# Patient Record
Sex: Female | Born: 1970 | Race: Black or African American | Hispanic: No | Smoking: Current every day smoker
Health system: Southern US, Community
[De-identification: ages and names within clinical notes are randomized; demographics above are authoritative.]

## PROBLEM LIST (undated history)

## (undated) ENCOUNTER — Ambulatory Visit: Admission: EM

## (undated) HISTORY — PX: VAGINAL HYSTERECTOMY: SUR661

## (undated) HISTORY — PX: APPENDECTOMY: SHX54

## (undated) HISTORY — PX: TONSILLECTOMY: SHX5217

---

## 2004-11-20 ENCOUNTER — Emergency Department: Payer: Self-pay | Admitting: Emergency Medicine

## 2006-04-11 ENCOUNTER — Ambulatory Visit: Payer: Self-pay | Admitting: Family

## 2009-09-18 ENCOUNTER — Emergency Department: Payer: Self-pay | Admitting: Emergency Medicine

## 2013-05-24 ENCOUNTER — Emergency Department: Payer: Self-pay | Admitting: Emergency Medicine

## 2015-07-08 ENCOUNTER — Ambulatory Visit: Payer: Self-pay | Admitting: Family Medicine

## 2015-10-04 ENCOUNTER — Other Ambulatory Visit: Payer: Self-pay | Admitting: Family Medicine

## 2015-10-17 ENCOUNTER — Other Ambulatory Visit: Payer: Self-pay

## 2015-11-04 ENCOUNTER — Ambulatory Visit: Payer: Self-pay | Admitting: Family Medicine

## 2015-11-21 ENCOUNTER — Other Ambulatory Visit: Payer: Self-pay

## 2015-11-21 MED ORDER — ATENOLOL 25 MG PO TABS: 25.0000 mg | ORAL_TABLET | Freq: Every day | ORAL | Status: DC

## 2015-11-21 MED ORDER — HYDROCHLOROTHIAZIDE 25 MG PO TABS: 25.0000 mg | ORAL_TABLET | Freq: Every day | ORAL | Status: DC

## 2015-11-28 ENCOUNTER — Ambulatory Visit: Payer: Self-pay | Admitting: Family Medicine

## 2015-11-28 ENCOUNTER — Encounter: Payer: Self-pay | Admitting: Family Medicine

## 2015-11-28 ENCOUNTER — Ambulatory Visit (INDEPENDENT_AMBULATORY_CARE_PROVIDER_SITE_OTHER): Payer: Commercial Managed Care - PPO | Admitting: Family Medicine

## 2015-11-28 VITALS — BP 140/100 | HR 80 | Ht 66.0 in | Wt 192.0 lb

## 2015-11-28 DIAGNOSIS — I10 Essential (primary) hypertension: Secondary | ICD-10-CM | POA: Diagnosis not present

## 2015-11-28 DIAGNOSIS — F419 Anxiety disorder, unspecified: Secondary | ICD-10-CM

## 2015-11-28 DIAGNOSIS — F32A Depression, unspecified: Secondary | ICD-10-CM

## 2015-11-28 DIAGNOSIS — F418 Other specified anxiety disorders: Secondary | ICD-10-CM | POA: Diagnosis not present

## 2015-11-28 DIAGNOSIS — F329 Major depressive disorder, single episode, unspecified: Secondary | ICD-10-CM

## 2015-11-28 DIAGNOSIS — M1731 Unilateral post-traumatic osteoarthritis, right knee: Secondary | ICD-10-CM | POA: Diagnosis not present

## 2015-11-28 MED ORDER — HYDROCHLOROTHIAZIDE 25 MG PO TABS: 25.0000 mg | ORAL_TABLET | Freq: Every day | ORAL | Status: DC

## 2015-11-28 MED ORDER — MELOXICAM 15 MG PO TABS: 15.0000 mg | ORAL_TABLET | Freq: Every day | ORAL | Status: DC

## 2015-11-28 MED ORDER — ATENOLOL 25 MG PO TABS: 25.0000 mg | ORAL_TABLET | Freq: Every day | ORAL | Status: DC

## 2015-11-28 MED ORDER — CITALOPRAM HYDROBROMIDE 20 MG PO TABS: ORAL_TABLET | ORAL | Status: DC

## 2015-11-28 NOTE — Progress Notes (Signed)
Name: Mallory Gardner   MRN: [id number]    DOB: 07/06/1971   Date:11/28/2015       Progress Note  Subjective  Chief Complaint  Chief Complaint  Patient presents with  . Hypertension  . Depression    Hypertension This is a chronic problem. The current episode started more than 1 year ago. The problem has been waxing and waning since onset. The problem is uncontrolled (no meds at present). Pertinent negatives include no anxiety, blurred vision, chest pain, headaches, malaise/fatigue, neck pain, orthopnea, palpitations, peripheral edema, PND, shortness of breath or sweats. There are no associated agents to hypertension. Risk factors for coronary artery disease include dyslipidemia and smoking/tobacco exposure. Past treatments include beta blockers and diuretics. The current treatment provides no improvement. There are no compliance problems.  There is no history of angina, kidney disease, CAD/MI, CVA, heart failure, left ventricular hypertrophy, PVD, renovascular disease or retinopathy. There is no history of chronic renal disease or a hypertension causing med.  Depression        This is a chronic problem.  The current episode started more than 1 year ago.   The problem has been gradually improving since onset.  Associated symptoms include insomnia and sad.  Associated symptoms include no decreased concentration, no fatigue, no helplessness, no hopelessness, not irritable, no restlessness, no decreased interest, no appetite change, no myalgias, no headaches and no suicidal ideas.     The symptoms are aggravated by family issues (maternal death).  Past treatments include SSRIs - Selective serotonin reuptake inhibitors.  Compliance with treatment is good.   Pertinent negatives include no anxiety. Knee Pain  The incident occurred more than 1 week ago. The injury mechanism was a twisting injury. The pain is present in the right knee. The pain is mild. The pain has been improving since onset. Pertinent  negatives include no loss of motion or tingling. The symptoms are aggravated by movement. She has tried acetaminophen and NSAIDs for the symptoms. The treatment provided mild relief.    No problem-specific assessment & plan notes found for this encounter.   No past medical history on file.  Past Surgical History  Procedure Laterality Date  . Vaginal hysterectomy    . Appendectomy    . Tonsillectomy      No family history on file.  Social History   Social History  . Marital Status: Divorced    Spouse Name: N/A  . Number of Children: N/A  . Years of Education: N/A   Occupational History  . Not on file.   Social History Main Topics  . Smoking status: Current Every Day Smoker -- 0.50 packs/day    Types: Cigarettes  . Smokeless tobacco: Not on file  . Alcohol Use: 0.0 oz/week    0 Standard drinks or equivalent per week  . Drug Use: No  . Sexual Activity: Not on file   Other Topics Concern  . Not on file   Social History Narrative  . No narrative on file    No Known Allergies   Review of Systems  Constitutional: Negative for fever, chills, weight loss, malaise/fatigue, appetite change and fatigue.  HENT: Negative for ear discharge, ear pain and sore throat.   Eyes: Negative for blurred vision.  Respiratory: Negative for cough, sputum production, shortness of breath and wheezing.   Cardiovascular: Negative for chest pain, palpitations, orthopnea, leg swelling and PND.  Gastrointestinal: Negative for heartburn, nausea, abdominal pain, diarrhea, constipation, blood in stool and melena.  Genitourinary: Negative  for dysuria, urgency, frequency and hematuria.  Musculoskeletal: Negative for myalgias, back pain, joint pain and neck pain.  Skin: Negative for rash.  Neurological: Negative for dizziness, tingling, sensory change, focal weakness and headaches.  Endo/Heme/Allergies: Negative for environmental allergies and polydipsia. Does not bruise/bleed easily.   Psychiatric/Behavioral: Positive for depression. Negative for suicidal ideas and decreased concentration. The patient has insomnia. The patient is not nervous/anxious.      Objective  Filed Vitals:   11/28/15 1649  BP: 140/100  Pulse: 80  Height: 5\' 6"  (1.676 m)  Weight: 192 lb (87.091 kg)    Physical Exam  Constitutional: She is well-developed, well-nourished, and in no distress. She is not irritable. No distress.  HENT:  Head: Normocephalic and atraumatic.  Right Ear: External ear normal.  Left Ear: External ear normal.  Nose: Nose normal.  Mouth/Throat: Oropharynx is clear and moist.  Eyes: Conjunctivae and EOM are normal. Pupils are equal, round, and reactive to light. Right eye exhibits no discharge. Left eye exhibits no discharge.  Neck: Normal range of motion. Neck supple. No JVD present. No thyromegaly present.  Cardiovascular: Normal rate, regular rhythm, normal heart sounds and intact distal pulses.  Exam reveals no gallop and no friction rub.   No murmur heard. Pulmonary/Chest: Effort normal and breath sounds normal. Right breast exhibits no inverted nipple, no mass, no nipple discharge, no skin change and no tenderness. Left breast exhibits no inverted nipple, no mass, no nipple discharge, no skin change and no tenderness. Breasts are symmetrical.  Abdominal: Soft. Bowel sounds are normal. She exhibits no mass. There is no tenderness. There is no guarding.  Musculoskeletal: Normal range of motion. She exhibits no edema.  Lymphadenopathy:    She has no cervical adenopathy.  Neurological: She is alert. She has normal reflexes.  Skin: Skin is warm and dry. She is not diaphoretic.  Psychiatric: Mood and affect normal.  Nursing note and vitals reviewed.     Assessment & Plan  Problem List Items Addressed This Visit    None    Visit Diagnoses    Essential hypertension    -  Primary    Relevant Medications    atenolol (TENORMIN) 25 MG tablet     hydrochlorothiazide (HYDRODIURIL) 25 MG tablet    Post-traumatic osteoarthritis of right knee        Relevant Medications    meloxicam (MOBIC) 15 MG tablet    Anxiety and depression        Relevant Medications    citalopram (CELEXA) 20 MG tablet         Dr. Hayden Rasmusseneanna Selby Foisy [facility]  11/28/2015

## 2016-07-10 ENCOUNTER — Other Ambulatory Visit: Payer: Self-pay | Admitting: Family Medicine

## 2016-07-10 DIAGNOSIS — F419 Anxiety disorder, unspecified: Principal | ICD-10-CM

## 2016-07-10 DIAGNOSIS — F32A Depression, unspecified: Secondary | ICD-10-CM

## 2016-07-10 DIAGNOSIS — F329 Major depressive disorder, single episode, unspecified: Secondary | ICD-10-CM

## 2016-07-10 DIAGNOSIS — M1731 Unilateral post-traumatic osteoarthritis, right knee: Secondary | ICD-10-CM

## 2016-07-13 ENCOUNTER — Other Ambulatory Visit: Payer: Self-pay

## 2016-07-13 DIAGNOSIS — I10 Essential (primary) hypertension: Secondary | ICD-10-CM

## 2016-07-13 MED ORDER — ATENOLOL 25 MG PO TABS: 25.0000 mg | ORAL_TABLET | Freq: Every day | ORAL | 0 refills | Status: DC

## 2016-07-13 MED ORDER — HYDROCHLOROTHIAZIDE 25 MG PO TABS: 25.0000 mg | ORAL_TABLET | Freq: Every day | ORAL | 0 refills | Status: DC

## 2016-08-07 ENCOUNTER — Ambulatory Visit (INDEPENDENT_AMBULATORY_CARE_PROVIDER_SITE_OTHER): Payer: Commercial Managed Care - PPO | Admitting: Family Medicine

## 2016-08-07 VITALS — BP 142/92 | HR 78 | Temp 98.7°F | Ht 66.0 in | Wt 215.0 lb

## 2016-08-07 DIAGNOSIS — E785 Hyperlipidemia, unspecified: Secondary | ICD-10-CM

## 2016-08-07 DIAGNOSIS — M1731 Unilateral post-traumatic osteoarthritis, right knee: Secondary | ICD-10-CM | POA: Diagnosis not present

## 2016-08-07 DIAGNOSIS — F418 Other specified anxiety disorders: Secondary | ICD-10-CM

## 2016-08-07 DIAGNOSIS — F17210 Nicotine dependence, cigarettes, uncomplicated: Secondary | ICD-10-CM | POA: Diagnosis not present

## 2016-08-07 DIAGNOSIS — F32A Depression, unspecified: Secondary | ICD-10-CM

## 2016-08-07 DIAGNOSIS — F419 Anxiety disorder, unspecified: Secondary | ICD-10-CM

## 2016-08-07 DIAGNOSIS — I10 Essential (primary) hypertension: Secondary | ICD-10-CM

## 2016-08-07 DIAGNOSIS — F329 Major depressive disorder, single episode, unspecified: Secondary | ICD-10-CM

## 2016-08-07 MED ORDER — VARENICLINE TARTRATE 0.5 MG X 11 & 1 MG X 42 PO MISC: ORAL | 0 refills | Status: AC

## 2016-08-07 MED ORDER — MELOXICAM 15 MG PO TABS: 15.0000 mg | ORAL_TABLET | Freq: Every day | ORAL | 6 refills | Status: DC

## 2016-08-07 MED ORDER — CITALOPRAM HYDROBROMIDE 20 MG PO TABS: 20.0000 mg | ORAL_TABLET | Freq: Every day | ORAL | 6 refills | Status: DC

## 2016-08-07 MED ORDER — ATENOLOL 25 MG PO TABS: 25.0000 mg | ORAL_TABLET | Freq: Every day | ORAL | 6 refills | Status: DC

## 2016-08-07 MED ORDER — HYDROCHLOROTHIAZIDE 25 MG PO TABS: 25.0000 mg | ORAL_TABLET | Freq: Every day | ORAL | 6 refills | Status: DC

## 2016-08-07 NOTE — Progress Notes (Signed)
Name: Mallory Gardner   MRN: [id number]    DOB: 05/02/1971   Date:08/07/2016       Progress Note  Subjective  Chief Complaint  Chief Complaint  Patient presents with  . Follow-up    mediaction refill    Hypertension  This is a chronic problem. The current episode started more than 1 year ago. The problem has been waxing and waning since onset. The problem is controlled. Pertinent negatives include no anxiety, blurred vision, chest pain, headaches, malaise/fatigue, neck pain, orthopnea, palpitations, peripheral edema, PND, shortness of breath or sweats. There are no associated agents to hypertension. There are no known risk factors for coronary artery disease. Past treatments include beta blockers and diuretics. The current treatment provides moderate improvement. There are no compliance problems.  There is no history of angina, kidney disease, CAD/MI, CVA, heart failure, left ventricular hypertrophy, PVD, renovascular disease or retinopathy. There is no history of chronic renal disease or a hypertension causing med.  Knee Pain   There was no injury mechanism. The pain is present in the right knee ("decades"). The quality of the pain is described as aching. The pain is moderate. The pain has been intermittent since onset. Pertinent negatives include no inability to bear weight, loss of motion, loss of sensation, muscle weakness, numbness or tingling. She reports no foreign bodies present. Nothing aggravates the symptoms. She has tried NSAIDs for the symptoms.  Depression         The current episode started more than 1 year ago.   The problem has been waxing and waning since onset.  Associated symptoms include irritable.  Associated symptoms include no decreased concentration, no fatigue, no helplessness, no hopelessness, does not have insomnia, no restlessness, no decreased interest, no appetite change, no body aches, no myalgias, no headaches, no indigestion, not sad and no suicidal ideas.  Past  treatments include SSRIs - Selective serotonin reuptake inhibitors.  Compliance with treatment is good.  Previous treatment provided mild relief.   Pertinent negatives include no anxiety.   No problem-specific Assessment & Plan notes found for this encounter.   No past medical history on file.  Past Surgical History:  Procedure Laterality Date  . APPENDECTOMY    . TONSILLECTOMY    . VAGINAL HYSTERECTOMY      No family history on file.  Social History   Social History  . Marital status: Divorced    Spouse name: N/A  . Number of children: N/A  . Years of education: N/A   Occupational History  . Not on file.   Social History Main Topics  . Smoking status: Current Every Day Smoker    Packs/day: 0.50    Types: Cigarettes  . Smokeless tobacco: Not on file  . Alcohol use 0.0 oz/week  . Drug use: No  . Sexual activity: Not on file   Other Topics Concern  . Not on file   Social History Narrative  . No narrative on file    No Known Allergies   Review of Systems  Constitutional: Negative for appetite change, chills, fatigue, fever, malaise/fatigue and weight loss.  HENT: Negative for ear discharge, ear pain and sore throat.   Eyes: Negative for blurred vision.  Respiratory: Negative for cough, sputum production, shortness of breath and wheezing.   Cardiovascular: Negative for chest pain, palpitations, orthopnea, leg swelling and PND.  Gastrointestinal: Negative for abdominal pain, blood in stool, constipation, diarrhea, heartburn, melena and nausea.  Genitourinary: Negative for dysuria, frequency, hematuria and urgency.  Musculoskeletal: Negative for back pain, joint pain, myalgias and neck pain.  Skin: Negative for rash.  Neurological: Negative for dizziness, tingling, sensory change, focal weakness, numbness and headaches.  Endo/Heme/Allergies: Negative for environmental allergies and polydipsia. Does not bruise/bleed easily.  Psychiatric/Behavioral: Positive for  depression. Negative for decreased concentration and suicidal ideas. The patient is not nervous/anxious and does not have insomnia.      Objective  Vitals:   08/07/16 1604  BP: (!) 142/92  Pulse: 78  Temp: 98.7 F (37.1 C)  Weight: 215 lb (97.5 kg)  Height: 5\' 6"  (1.676 m)    Physical Exam  Constitutional: She is well-developed, well-nourished, and in no distress. She is irritable. No distress.  HENT:  Head: Normocephalic and atraumatic.  Right Ear: External ear normal.  Left Ear: External ear normal.  Nose: Nose normal.  Mouth/Throat: Oropharynx is clear and moist.  Eyes: Conjunctivae and EOM are normal. Pupils are equal, round, and reactive to light. Right eye exhibits no discharge. Left eye exhibits no discharge.  Neck: Normal range of motion. Neck supple. No JVD present. No thyromegaly present.  Cardiovascular: Normal rate, regular rhythm, normal heart sounds and intact distal pulses.  Exam reveals no gallop and no friction rub.   No murmur heard. Pulmonary/Chest: Effort normal and breath sounds normal.  Abdominal: Soft. Bowel sounds are normal. She exhibits no mass. There is no tenderness. There is no guarding.  Musculoskeletal: Normal range of motion. She exhibits no edema.  Lymphadenopathy:    She has no cervical adenopathy.  Neurological: She is alert.  Skin: Skin is warm and dry. She is not diaphoretic.  Psychiatric: Mood and affect normal.  Nursing note and vitals reviewed.     Assessment & Plan  Problem List Items Addressed This Visit    None    Visit Diagnoses    Cigarette nicotine dependence without complication    -  Primary   Relevant Medications   varenicline (CHANTIX STARTING MONTH PAK) 0.5 MG X 11 & 1 MG X 42 tablet   Essential hypertension       Relevant Medications   atenolol (TENORMIN) 25 MG tablet   hydrochlorothiazide (HYDRODIURIL) 25 MG tablet   Other Relevant Orders   Renal function panel   Anxiety and depression       Relevant  Medications   citalopram (CELEXA) 20 MG tablet   Post-traumatic osteoarthritis of right knee       Relevant Medications   meloxicam (MOBIC) 15 MG tablet   Hyperlipidemia, unspecified hyperlipidemia type       Relevant Medications   atenolol (TENORMIN) 25 MG tablet   hydrochlorothiazide (HYDRODIURIL) 25 MG tablet   Other Relevant Orders   Lipid Profile        Dr. Hayden Rasmusseneanna Emika Tiano [facility]  08/07/16

## 2016-08-11 LAB — RENAL FUNCTION PANEL
Albumin: 4.5 g/dL (ref 3.5–5.5)
BUN / CREAT RATIO: 9 (ref 9–23)
BUN: 8 mg/dL (ref 6–24)
CO2: 29 mmol/L (ref 18–29)
Calcium: 10 mg/dL (ref 8.7–10.2)
Chloride: 99 mmol/L (ref 96–106)
Creatinine, Ser: 0.89 mg/dL (ref 0.57–1.00)
GFR, EST AFRICAN AMERICAN: 90 mL/min/{1.73_m2} (ref >59)
GFR, EST NON AFRICAN AMERICAN: 78 mL/min/{1.73_m2} (ref >59)
GLUCOSE: 89 mg/dL (ref 65–99)
PHOSPHORUS: 3.9 mg/dL (ref 2.5–4.5)
POTASSIUM: 4.2 mmol/L (ref 3.5–5.2)
SODIUM: 143 mmol/L (ref 134–144)

## 2016-08-11 LAB — LIPID PANEL W/O CHOL/HDL RATIO
Cholesterol, Total: 196 mg/dL (ref 100–199)
HDL: 58 mg/dL (ref >39)
LDL Calculated: 95 mg/dL (ref 0–99)
TRIGLYCERIDES: 217 mg/dL — AB (ref 0–149)
VLDL Cholesterol Cal: 43 mg/dL — ABNORMAL HIGH (ref 5–40)

## 2016-08-14 ENCOUNTER — Other Ambulatory Visit: Payer: Self-pay

## 2016-12-01 ENCOUNTER — Emergency Department: Payer: BLUE CROSS/BLUE SHIELD

## 2016-12-01 ENCOUNTER — Emergency Department
Admission: EM | Admit: 2016-12-01 | Discharge: 2016-12-01 | Disposition: A | Discharge status: Home / Self Care | Payer: BLUE CROSS/BLUE SHIELD | Attending: Emergency Medicine | Admitting: Emergency Medicine

## 2016-12-01 DIAGNOSIS — M25572 Pain in left ankle and joints of left foot: Secondary | ICD-10-CM

## 2016-12-01 DIAGNOSIS — F17210 Nicotine dependence, cigarettes, uncomplicated: Secondary | ICD-10-CM | POA: Diagnosis not present

## 2016-12-01 MED ORDER — TRAMADOL HCL 50 MG PO TABS: 50.0000 mg | ORAL_TABLET | Freq: Four times a day (QID) | ORAL | 0 refills | Status: AC | PRN

## 2016-12-01 NOTE — Discharge Instructions (Signed)
Please seek medical attention for any high fevers, chest pain, shortness of breath, change in behavior, persistent vomiting, bloody stool or any other new or concerning symptoms.  

## 2016-12-01 NOTE — ED Provider Notes (Signed)
[facility] Emergency Department Provider Note  ____________________________________________   I have reviewed the triage vital signs and the nursing notes.   HISTORY  Chief Complaint Ankle Pain   History limited by: Not Limited   HPI Mallory Gardner is a 46 y.o. female who presents to the emergency department today because of concerns for left ankle pain. She states that left ankle was slightly sore yesterday. However woke her up at night. She states is it extremely painful. It is worse when she plantar flexes the foot. She also feels like there is some swelling there. Patient denies any injury. Denies similar symptoms in the past. Denies any previous history of fracture of that ankle.   No past medical history on file.  There are no active problems to display for this patient.   Past Surgical History:  Procedure Laterality Date  . APPENDECTOMY    . TONSILLECTOMY    . VAGINAL HYSTERECTOMY      Prior to Admission medications   Medication Sig Start Date End Date Taking? Authorizing Provider  atenolol (TENORMIN) 25 MG tablet Take 1 tablet (25 mg total) by mouth daily. 08/07/16   Duanne Limerick, MD  citalopram (CELEXA) 20 MG tablet Take 1 tablet (20 mg total) by mouth daily. 08/07/16   Duanne Limerick, MD  hydrochlorothiazide (HYDRODIURIL) 25 MG tablet Take 1 tablet (25 mg total) by mouth daily. sched appt for med refill 08/07/16   Duanne Limerick, MD  meloxicam (MOBIC) 15 MG tablet Take 1 tablet (15 mg total) by mouth daily. 08/07/16   Duanne Limerick, MD  varenicline (CHANTIX STARTING MONTH PAK) 0.5 MG X 11 & 1 MG X 42 tablet Take one 0.5 mg tablet by mouth once daily for 3 days, then increase to one 0.5 mg tablet twice daily for 4 days, then increase to one 1 mg tablet twice daily. 08/07/16   Duanne Limerick, MD    Allergies Patient has no known allergies.  No family history on file.  Social History Social History  Substance Use Topics  . Smoking status:  Current Every Day Smoker    Packs/day: 0.50    Types: Cigarettes  . Smokeless tobacco: Not on file  . Alcohol use 0.0 oz/week    Review of Systems  Constitutional: Negative for fever. Cardiovascular: Negative for chest pain. Respiratory: Negative for shortness of breath. Gastrointestinal: Negative for abdominal pain, vomiting and diarrhea. Musculoskeletal: Positive for left ankle pain. Skin: Negative for rash. Neurological: Negative for headaches, focal weakness or numbness.  10-point ROS otherwise negative.  ____________________________________________   PHYSICAL EXAM:  VITAL SIGNS: ED Triage Vitals  Enc Vitals Group     BP 12/01/16 0529 (!) 149/92     Pulse Rate 12/01/16 0529 82     Resp 12/01/16 0529 20     Temp 12/01/16 0529 98 F (36.7 C)     Temp Source 12/01/16 0529 Oral     SpO2 12/01/16 0529 100 %     Weight 12/01/16 0529 210 lb (95.3 kg)     Height 12/01/16 0529  (1.676 m)     Head Circumference --      Peak Flow --      Pain Score 12/01/16 0528 8   Constitutional: Alert and oriented. Well appearing and in no distress. Eyes: Conjunctivae are normal. Normal extraocular movements. ENT   Head: Normocephalic and atraumatic.   Nose: No congestion/rhinnorhea.   Mouth/Throat: Mucous membranes are moist.   Neck:  No stridor. Hematological/Lymphatic/Immunilogical: No cervical lymphadenopathy. Cardiovascular: Normal rate, regular rhythm.  No murmurs, rubs, or gallops.  Respiratory: Normal respiratory effort without tachypnea nor retractions. Breath sounds are clear and equal bilaterally. No wheezes/rales/rhonchi. Gastrointestinal: Soft and non tender. No rebound. No guarding.  Genitourinary: Deferred Musculoskeletal: Left ankle with small amount of swelling. No skin change or erythema. No warmth. No significant tenderness to palpation. No significant tenderness to manipulation, however slightly worse with plantar flexion. Neurologic:  Normal speech  and language. No gross focal neurologic deficits are appreciated.  Skin:  Skin is warm, dry and intact. No rash noted. Psychiatric: Mood and affect are normal. Speech and behavior are normal. Patient exhibits appropriate insight and judgment.  ____________________________________________    LABS (pertinent positives/negatives)  None  ____________________________________________   EKG  None  ____________________________________________    RADIOLOGY  Left ankle IMPRESSION:  Subchondral lucency in the lateral talar dome consistent with  osteochondral lesion. This can be seen in the setting of repetitive  microtrauma. Osteoarthritis is fell less likely given preservation  of joint space. Small tibial talar joint effusion.    If there are any signs or symptoms of infection, MRI can be  considered.       ____________________________________________   PROCEDURES  Procedures  ____________________________________________   INITIAL IMPRESSION / ASSESSMENT AND PLAN / ED COURSE  Pertinent labs & imaging results that were available during my care of the patient were reviewed by me and considered in my medical decision making (see chart for details).  Patient presented to the emergency department today because of concerns for left ankle pain. X-ray shows a subchondral lucency. At this point given lack of erythema, warmth to the joint, I have low suspicion for infection. Patient does state that she has knee issues and so does have to, say it is slightly odd manner for her foot and ankle. Will place patient in splint and give podiatry follow-up.  ____________________________________________   FINAL CLINICAL IMPRESSION(S) / ED DIAGNOSES  Final diagnoses:  Acute left ankle pain     Note: This dictation was prepared with Dragon dictation. Any transcriptional errors that result from this process are unintentional     Phineas Semen, MD 12/01/16 [phone]

## 2016-12-01 NOTE — ED Triage Notes (Signed)
Patient reports left ankle pain.  Denies any known injury.

## 2016-12-01 NOTE — ED Notes (Signed)
Assisted pt out of car to wheelchair with c/o left ankle pain; no injury

## 2017-03-25 ENCOUNTER — Other Ambulatory Visit: Payer: Self-pay | Admitting: Family Medicine

## 2017-03-25 DIAGNOSIS — F329 Major depressive disorder, single episode, unspecified: Secondary | ICD-10-CM

## 2017-03-25 DIAGNOSIS — M1731 Unilateral post-traumatic osteoarthritis, right knee: Secondary | ICD-10-CM

## 2017-03-25 DIAGNOSIS — F32A Depression, unspecified: Secondary | ICD-10-CM

## 2017-03-25 DIAGNOSIS — F419 Anxiety disorder, unspecified: Principal | ICD-10-CM

## 2017-03-26 ENCOUNTER — Other Ambulatory Visit: Payer: Self-pay

## 2017-03-26 DIAGNOSIS — I10 Essential (primary) hypertension: Secondary | ICD-10-CM

## 2017-03-26 MED ORDER — HYDROCHLOROTHIAZIDE 25 MG PO TABS: 25.0000 mg | ORAL_TABLET | Freq: Every day | ORAL | 0 refills | Status: DC

## 2017-03-26 MED ORDER — ATENOLOL 25 MG PO TABS: 25.0000 mg | ORAL_TABLET | Freq: Every day | ORAL | 0 refills | Status: DC

## 2017-04-17 ENCOUNTER — Ambulatory Visit: Payer: Commercial Managed Care - PPO | Admitting: Family Medicine

## 2017-04-26 ENCOUNTER — Encounter: Payer: Self-pay | Admitting: Family Medicine

## 2017-04-26 ENCOUNTER — Ambulatory Visit (INDEPENDENT_AMBULATORY_CARE_PROVIDER_SITE_OTHER): Payer: BLUE CROSS/BLUE SHIELD | Admitting: Family Medicine

## 2017-04-26 VITALS — BP 140/98 | HR 80 | Ht 66.0 in | Wt 212.0 lb

## 2017-04-26 DIAGNOSIS — Z1231 Encounter for screening mammogram for malignant neoplasm of breast: Secondary | ICD-10-CM

## 2017-04-26 DIAGNOSIS — Z1239 Encounter for other screening for malignant neoplasm of breast: Secondary | ICD-10-CM

## 2017-04-26 DIAGNOSIS — F329 Major depressive disorder, single episode, unspecified: Secondary | ICD-10-CM | POA: Diagnosis not present

## 2017-04-26 DIAGNOSIS — Z23 Encounter for immunization: Secondary | ICD-10-CM | POA: Diagnosis not present

## 2017-04-26 DIAGNOSIS — F419 Anxiety disorder, unspecified: Secondary | ICD-10-CM | POA: Diagnosis not present

## 2017-04-26 DIAGNOSIS — M1731 Unilateral post-traumatic osteoarthritis, right knee: Secondary | ICD-10-CM

## 2017-04-26 DIAGNOSIS — F32A Depression, unspecified: Secondary | ICD-10-CM

## 2017-04-26 DIAGNOSIS — I10 Essential (primary) hypertension: Secondary | ICD-10-CM

## 2017-04-26 MED ORDER — ATENOLOL 25 MG PO TABS: 25.0000 mg | ORAL_TABLET | Freq: Every day | ORAL | 1 refills | Status: AC

## 2017-04-26 MED ORDER — MELOXICAM 15 MG PO TABS: 15.0000 mg | ORAL_TABLET | Freq: Every day | ORAL | 1 refills | Status: AC

## 2017-04-26 MED ORDER — HYDROCHLOROTHIAZIDE 25 MG PO TABS: 25.0000 mg | ORAL_TABLET | Freq: Every day | ORAL | 1 refills | Status: AC

## 2017-04-26 MED ORDER — CITALOPRAM HYDROBROMIDE 20 MG PO TABS: 20.0000 mg | ORAL_TABLET | Freq: Every day | ORAL | 1 refills | Status: DC

## 2017-04-26 NOTE — Progress Notes (Signed)
Name: Mallory Gardner   MRN: [id number]    DOB: May 06, 1971   Date:04/26/2017       Progress Note  Subjective  Chief Complaint  Chief Complaint  Patient presents with  . Hypertension  . Depression    Hypertension  This is a chronic problem. The current episode started more than 1 year ago. The problem has been waxing and waning since onset. The problem is controlled. Pertinent negatives include no anxiety, blurred vision, chest pain, headaches, malaise/fatigue, neck pain, orthopnea, palpitations, peripheral edema, PND, shortness of breath or sweats. There are no associated agents to hypertension. There are no known risk factors for coronary artery disease. Past treatments include beta blockers and diuretics. The current treatment provides moderate improvement. There are no compliance problems.  There is no history of angina, kidney disease, CAD/MI, CVA, heart failure, left ventricular hypertrophy, PVD or retinopathy. There is no history of chronic renal disease, a hypertension causing med or renovascular disease.  Depression         This is a chronic problem.  The current episode started more than 1 year ago.   The onset quality is sudden.   The problem has been gradually improving since onset.  Associated symptoms include no decreased concentration, no fatigue, no helplessness, no hopelessness, does not have insomnia, not irritable, no restlessness, no decreased interest, no appetite change, no body aches, no myalgias, no headaches, no indigestion, not sad and no suicidal ideas.     The symptoms are aggravated by family issues.  Past treatments include SSRIs - Selective serotonin reuptake inhibitors.  Compliance with treatment is good.  Previous treatment provided moderate relief.   Pertinent negatives include no anxiety.   No problem-specific Assessment & Plan notes found for this encounter.   No past medical history on file.  Past Surgical History:  Procedure Laterality Date  . APPENDECTOMY     . TONSILLECTOMY    . VAGINAL HYSTERECTOMY      No family history on file.  Social History   Social History  . Marital status: Divorced    Spouse name: N/A  . Number of children: N/A  . Years of education: N/A   Occupational History  . Not on file.   Social History Main Topics  . Smoking status: Current Every Day Smoker    Packs/day: 0.50    Types: Cigarettes  . Smokeless tobacco: Never Used  . Alcohol use 0.0 oz/week  . Drug use: No  . Sexual activity: Not on file   Other Topics Concern  . Not on file   Social History Narrative  . No narrative on file    No Known Allergies  Outpatient Medications Prior to Visit  Medication Sig Dispense Refill  . atenolol (TENORMIN) 25 MG tablet Take 1 tablet (25 mg total) by mouth daily. 30 tablet 0  . citalopram (CELEXA) 20 MG tablet TAKE ONE TABLET BY MOUTH ONCE DAILY 30 tablet 0  . hydrochlorothiazide (HYDRODIURIL) 25 MG tablet Take 1 tablet (25 mg total) by mouth daily. sched appt for med refill 30 tablet 0  . meloxicam (MOBIC) 15 MG tablet TAKE ONE TABLET BY MOUTH ONCE DAILY 30 tablet 0  . traMADol (ULTRAM) 50 MG tablet Take 1 tablet (50 mg total) by mouth every 6 (six) hours as needed. (Patient not taking: Reported on 04/26/2017) 15 tablet 0  . varenicline (CHANTIX STARTING MONTH PAK) 0.5 MG X 11 & 1 MG X 42 tablet Take one 0.5 mg tablet by mouth once  daily for 3 days, then increase to one 0.5 mg tablet twice daily for 4 days, then increase to one 1 mg tablet twice daily. (Patient not taking: Reported on 04/26/2017) 53 tablet 0   No facility-administered medications prior to visit.     Review of Systems  Constitutional: Negative for appetite change, chills, fatigue, fever, malaise/fatigue and weight loss.  HENT: Negative for ear discharge, ear pain and sore throat.   Eyes: Negative for blurred vision.  Respiratory: Negative for cough, sputum production, shortness of breath and wheezing.   Cardiovascular: Negative for chest  pain, palpitations, orthopnea, leg swelling and PND.  Gastrointestinal: Negative for abdominal pain, blood in stool, constipation, diarrhea, heartburn, melena and nausea.  Genitourinary: Negative for dysuria, frequency, hematuria and urgency.  Musculoskeletal: Negative for back pain, joint pain, myalgias and neck pain.  Skin: Negative for rash.  Neurological: Negative for dizziness, tingling, sensory change, focal weakness and headaches.  Endo/Heme/Allergies: Negative for environmental allergies and polydipsia. Does not bruise/bleed easily.  Psychiatric/Behavioral: Positive for depression. Negative for decreased concentration and suicidal ideas. The patient is not nervous/anxious and does not have insomnia.      Objective  Vitals:   04/26/17 1405  BP: (!) 140/98  Pulse: 80  Weight: 212 lb (96.2 kg)  Height: 5\' 6"  (1.676 m)    Physical Exam  Constitutional: She is well-developed, well-nourished, and in no distress. She is not irritable. No distress.  HENT:  Head: Normocephalic and atraumatic.  Right Ear: External ear normal.  Left Ear: External ear normal.  Nose: Nose normal.  Mouth/Throat: Oropharynx is clear and moist.  Eyes: Pupils are equal, round, and reactive to light. Conjunctivae and EOM are normal. Right eye exhibits no discharge. Left eye exhibits no discharge.  Neck: Normal range of motion. Neck supple. No JVD present. No thyromegaly present.  Cardiovascular: Normal rate, regular rhythm, normal heart sounds and intact distal pulses.  Exam reveals no gallop and no friction rub.   No murmur heard. Pulmonary/Chest: Effort normal and breath sounds normal. She has no wheezes. She has no rales. Right breast exhibits no inverted nipple, no mass, no nipple discharge, no skin change and no tenderness. Left breast exhibits no inverted nipple, no mass, no nipple discharge, no skin change and no tenderness. Breasts are symmetrical.  Abdominal: Soft. Bowel sounds are normal. She  exhibits no mass. There is no tenderness. There is no guarding.  Musculoskeletal: Normal range of motion. She exhibits no edema.  Lymphadenopathy:    She has no cervical adenopathy.  Neurological: She is alert. She has normal reflexes.  Skin: Skin is warm and dry. She is not diaphoretic.  Psychiatric: Mood and affect normal.  Nursing note and vitals reviewed.     Assessment & Plan  Problem List Items Addressed This Visit    None    Visit Diagnoses    Essential hypertension    -  Primary   Relevant Medications   hydrochlorothiazide (HYDRODIURIL) 25 MG tablet   atenolol (TENORMIN) 25 MG tablet   Post-traumatic osteoarthritis of right knee       Relevant Medications   meloxicam (MOBIC) 15 MG tablet   Anxiety and depression       Relevant Medications   citalopram (CELEXA) 20 MG tablet   Need for diphtheria-tetanus-pertussis (Tdap) vaccine       Relevant Orders   Tdap vaccine greater than or equal to 7yo IM (Completed)   Breast screening       Relevant Orders  MM Digital Screening      Meds ordered this encounter  Medications  . hydrochlorothiazide (HYDRODIURIL) 25 MG tablet    Sig: Take 1 tablet (25 mg total) by mouth daily. sched appt for med refill    Dispense:  90 tablet    Refill:  1  . atenolol (TENORMIN) 25 MG tablet    Sig: Take 1 tablet (25 mg total) by mouth daily.    Dispense:  90 tablet    Refill:  1  . meloxicam (MOBIC) 15 MG tablet    Sig: Take 1 tablet (15 mg total) by mouth daily.    Dispense:  90 tablet    Refill:  1    Needs appt last time filling  . citalopram (CELEXA) 20 MG tablet    Sig: Take 1 tablet (20 mg total) by mouth daily.    Dispense:  90 tablet    Refill:  1    Needs appt- last time filling      Dr. Elizabeth Sauer [facility]  04/26/17

## 2017-10-30 ENCOUNTER — Ambulatory Visit: Payer: BLUE CROSS/BLUE SHIELD | Admitting: Family Medicine

## 2017-11-26 ENCOUNTER — Other Ambulatory Visit: Payer: Self-pay | Admitting: Family Medicine

## 2017-11-26 DIAGNOSIS — F419 Anxiety disorder, unspecified: Principal | ICD-10-CM

## 2017-11-26 DIAGNOSIS — F32A Depression, unspecified: Secondary | ICD-10-CM

## 2017-11-26 DIAGNOSIS — F329 Major depressive disorder, single episode, unspecified: Secondary | ICD-10-CM

## 2017-12-23 ENCOUNTER — Encounter: Payer: Self-pay | Admitting: Emergency Medicine

## 2017-12-23 ENCOUNTER — Emergency Department: Payer: 59

## 2017-12-23 ENCOUNTER — Other Ambulatory Visit: Payer: Self-pay

## 2017-12-23 DIAGNOSIS — S92512A Displaced fracture of proximal phalanx of left lesser toe(s), initial encounter for closed fracture: Secondary | ICD-10-CM | POA: Insufficient documentation

## 2017-12-23 DIAGNOSIS — Y998 Other external cause status: Secondary | ICD-10-CM | POA: Diagnosis not present

## 2017-12-23 DIAGNOSIS — Y92009 Unspecified place in unspecified non-institutional (private) residence as the place of occurrence of the external cause: Secondary | ICD-10-CM | POA: Diagnosis not present

## 2017-12-23 DIAGNOSIS — Z79899 Other long term (current) drug therapy: Secondary | ICD-10-CM | POA: Insufficient documentation

## 2017-12-23 DIAGNOSIS — S99922A Unspecified injury of left foot, initial encounter: Secondary | ICD-10-CM | POA: Diagnosis present

## 2017-12-23 DIAGNOSIS — W228XXA Striking against or struck by other objects, initial encounter: Secondary | ICD-10-CM | POA: Insufficient documentation

## 2017-12-23 DIAGNOSIS — F17210 Nicotine dependence, cigarettes, uncomplicated: Secondary | ICD-10-CM | POA: Diagnosis not present

## 2017-12-23 DIAGNOSIS — Y939 Activity, unspecified: Secondary | ICD-10-CM | POA: Diagnosis not present

## 2017-12-23 NOTE — ED Triage Notes (Signed)
Pt to triage via w/c with no distress noted; reports injuring left 4th toe PTA

## 2017-12-24 ENCOUNTER — Emergency Department
Admission: EM | Admit: 2017-12-24 | Discharge: 2017-12-24 | Disposition: A | Discharge status: Home / Self Care | Payer: 59 | Attending: Emergency Medicine | Admitting: Emergency Medicine

## 2017-12-24 DIAGNOSIS — S92512A Displaced fracture of proximal phalanx of left lesser toe(s), initial encounter for closed fracture: Secondary | ICD-10-CM

## 2017-12-24 MED ORDER — DOCUSATE SODIUM 100 MG PO CAPS: ORAL_CAPSULE | ORAL | 0 refills | Status: AC

## 2017-12-24 MED ORDER — HYDROCODONE-ACETAMINOPHEN 5-325 MG PO TABS: 1.0000 | ORAL_TABLET | Freq: Four times a day (QID) | ORAL | 0 refills | Status: AC | PRN

## 2017-12-24 MED ORDER — HYDROCODONE-ACETAMINOPHEN 5-325 MG PO TABS
1.0000 | ORAL_TABLET | Freq: Once | ORAL | Status: AC
  Administered 2017-12-24: 1 via ORAL
  Filled 2017-12-24: qty 1

## 2017-12-24 NOTE — Discharge Instructions (Signed)
Please try to avoid bearing weight on your injured foot.  Use the orthopedic shoe that was provided and use crutches as much as possible, at least until you are able to follow up with Dr. Orland Jarredroxler or one of his podiatry colleagues.  (You may also follow up with your regular orthopedic surgeon.)  Use over-the-counter pain medication as needed according to label directions.  Take Norco as prescribed for severe pain. Do not drink alcohol, drive or participate in any other potentially dangerous activities while taking this medication as it may make you sleepy. Do not take this medication with any other sedating medications, either prescription or over-the-counter. If you were prescribed Percocet or Vicodin, do not take these with acetaminophen (Tylenol) as it is already contained within these medications.   This medication is an opiate (or narcotic) pain medication and can be habit forming.  Use it as little as possible to achieve adequate pain control.  Do not use or use it with extreme caution if you have a history of opiate abuse or dependence.  If you are on a pain contract with your primary care doctor or a pain specialist, be sure to let them know you were prescribed this medication today from the [facility].  This medication is intended for your use only - do not give any to anyone else and keep it in a secure place where nobody else, especially children, have access to it.  It will also cause or worsen constipation, so you may want to consider taking an over-the-counter stool softener while you are taking this medication.

## 2017-12-24 NOTE — ED Provider Notes (Signed)
[facility] Emergency Department Provider Note  ____________________________________________   First MD Initiated Contact with Patient 12/24/17 [phone]     (approximate)  I have reviewed the triage vital signs and the nursing notes.   HISTORY  Chief Complaint Toe Injury    HPI Mallory Gardner is a 47 y.o. female who presents for evaluation of acute onset pain in her fourth left toe after she excellently kicked a chair.  The pain was severe initially but is now mild but severe if she moves it or touches it.  No numbness or tingling.  No other injuries sustained during the accident.  It is a little bit swollen and a little bit bruised but otherwise normal in appearance.  History reviewed. No pertinent past medical history.  There are no active problems to display for this patient.   Past Surgical History:  Procedure Laterality Date  . APPENDECTOMY    . TONSILLECTOMY    . VAGINAL HYSTERECTOMY      Prior to Admission medications   Medication Sig Start Date End Date Taking? Authorizing Provider  atenolol (TENORMIN) 25 MG tablet Take 1 tablet (25 mg total) by mouth daily. 04/26/17   Duanne Limerick, MD  citalopram (CELEXA) 20 MG tablet TAKE 1 TABLET BY MOUTH ONCE DAILY *NEEDS  APPOINTMENT,  LAST  TIME  FILLING* 11/26/17   Duanne Limerick, MD  docusate sodium (COLACE) 100 MG capsule Take 1 tablet once or twice daily as needed for constipation while taking narcotic pain medicine 12/24/17   Loleta Rose, MD  hydrochlorothiazide (HYDRODIURIL) 25 MG tablet Take 1 tablet (25 mg total) by mouth daily. sched appt for med refill 04/26/17   Duanne Limerick, MD  HYDROcodone-acetaminophen (NORCO/VICODIN) 5-325 MG tablet Take 1 tablet by mouth every 6 (six) hours as needed for severe pain. 12/24/17   Loleta Rose, MD  meloxicam (MOBIC) 15 MG tablet Take 1 tablet (15 mg total) by mouth daily. 04/26/17   Duanne Limerick, MD  varenicline (CHANTIX STARTING MONTH PAK) 0.5 MG X 11 &  1 MG X 42 tablet Take one 0.5 mg tablet by mouth once daily for 3 days, then increase to one 0.5 mg tablet twice daily for 4 days, then increase to one 1 mg tablet twice daily. Patient not taking: Reported on 04/26/2017 08/07/16   Duanne Limerick, MD    Allergies Patient has no known allergies.  No family history on file.  Social History Social History   Tobacco Use  . Smoking status: Current Every Day Smoker    Packs/day: 0.50    Types: Cigarettes  . Smokeless tobacco: Never Used  Substance Use Topics  . Alcohol use: Yes    Alcohol/week: 0.0 oz  . Drug use: No    Review of Systems Constitutional: No fever/chills Cardiovascular: Denies chest pain. Respiratory: Denies shortness of breath. Gastrointestinal: No abdominal pain.  No nausea, no vomiting.   Musculoskeletal: Pain in left fourth toe as described above Neurological: Negative for headaches, focal weakness or numbness.   ____________________________________________   PHYSICAL EXAM:  VITAL SIGNS: ED Triage Vitals [12/23/17 2243]  Enc Vitals Group     BP 139/79     Pulse Rate 83     Resp 20     Temp 98 F (36.7 C)     Temp Source Oral     SpO2 97 %     Weight 90.7 kg (200 lb)     Height 1.651 m (5\' 5" )  Head Circumference      Peak Flow      Pain Score 4     Pain Loc      Pain Edu?      Excl. in GC?     Constitutional: Alert and oriented. Well appearing and in no acute distress. Cardiovascular: Normal rate, regular rhythm.  Respiratory: Normal respiratory effort.  No retractions.  Musculoskeletal: Minor swelling to proximal phalanx of left fourth toe consistent with a fracture seen on radiograph.  Neurovascularly intact.  Tender to palpation.  No deformity requiring reduction Neurologic:  Normal speech and language. No gross focal neurologic deficits are appreciated.  Skin:  Skin is warm, dry and intact. No rash noted.   ____________________________________________   LABS (all labs ordered are  listed, but only abnormal results are displayed)  Labs Reviewed - No data to display ____________________________________________  EKG  No indication for EKG ____________________________________________  RADIOLOGY I, Loleta Roseory Kaley Jutras, personally viewed and evaluated these images (plain radiographs) as part of my medical decision making, as well as reviewing the written report by the radiologist.  ED MD interpretation: Minimally displaced oblique fracture of the left fourth proximal phalanx as described in the radiology report  Official radiology report(s): Dg Toe 4th Left  Result Date: 12/23/2017 CLINICAL DATA:  Left fourth toe injury EXAM: LEFT FOURTH TOE COMPARISON:  None. FINDINGS: There is an oblique fracture of the proximal phalanx of the left fourth toe. Minimal displacement. IMPRESSION: Minimally displaced oblique fracture of the left fourth proximal phalanx. Electronically Signed   By: Deatra RobinsonKevin  Herman M.D.   On: 12/23/2017 23:28    ____________________________________________   PROCEDURES  Critical Care performed: No   Procedure(s) performed:   Procedures   ____________________________________________   INITIAL IMPRESSION / ASSESSMENT AND PLAN / ED COURSE  As part of my medical decision making, I reviewed the following data within the electronic MEDICAL RECORD NUMBER Nursing notes reviewed and incorporated and Radiograph reviewed , [place] controlled substance [place] reviewed   No injuries other than the proximal phalanx of the left fourth toe.  No indication for reduction.  Hard soled/orthopedic shoe, crutches, nonweightbearing.  Encouraged over-the-counter medication, gave a few Norco if the pain is severe.  Encouraged follow-up with podiatry.  The patient also already has an orthopedic surgeon and I explained the follow-up with orthopedics as acceptable as well.  She understands and agrees with the plan.     ____________________________________________  FINAL  CLINICAL IMPRESSION(S) / ED DIAGNOSES  Final diagnoses:  Closed displaced fracture of proximal phalanx of lesser toe of left foot, initial encounter     MEDICATIONS GIVEN DURING THIS VISIT:  Medications  HYDROcodone-acetaminophen (NORCO/VICODIN) 5-325 MG per tablet 1 tablet (1 tablet Oral Given 12/24/17 0421)     ED Discharge Orders        Ordered    HYDROcodone-acetaminophen (NORCO/VICODIN) 5-325 MG tablet  Every 6 hours PRN     12/24/17 0514    docusate sodium (COLACE) 100 MG capsule     12/24/17 0514       Note:  This document was prepared using Dragon voice recognition software and may include unintentional dictation errors.    Loleta RoseForbach, Anibal Quinby, MD 12/24/17 [phone]

## 2017-12-24 NOTE — ED Notes (Signed)
Pt has crutches at home she will use

## 2018-02-11 ENCOUNTER — Other Ambulatory Visit: Payer: Self-pay | Admitting: Internal Medicine

## 2018-02-11 DIAGNOSIS — R4589 Other symptoms and signs involving emotional state: Secondary | ICD-10-CM

## 2018-02-11 DIAGNOSIS — Z720 Tobacco use: Secondary | ICD-10-CM

## 2018-02-11 DIAGNOSIS — F329 Major depressive disorder, single episode, unspecified: Secondary | ICD-10-CM

## 2018-02-11 DIAGNOSIS — Z7689 Persons encountering health services in other specified circumstances: Secondary | ICD-10-CM

## 2018-02-11 DIAGNOSIS — Z1231 Encounter for screening mammogram for malignant neoplasm of breast: Secondary | ICD-10-CM

## 2018-02-11 DIAGNOSIS — I10 Essential (primary) hypertension: Secondary | ICD-10-CM

## 2018-02-11 DIAGNOSIS — M1711 Unilateral primary osteoarthritis, right knee: Secondary | ICD-10-CM

## 2018-06-18 ENCOUNTER — Ambulatory Visit
Admission: RE | Admit: 2018-06-18 | Discharge: 2018-06-18 | Disposition: A | Discharge status: Home / Self Care | Payer: 59 | Source: Ambulatory Visit | Attending: Internal Medicine | Admitting: Internal Medicine

## 2018-06-18 DIAGNOSIS — Z1231 Encounter for screening mammogram for malignant neoplasm of breast: Secondary | ICD-10-CM | POA: Insufficient documentation

## 2018-06-18 DIAGNOSIS — F329 Major depressive disorder, single episode, unspecified: Secondary | ICD-10-CM | POA: Insufficient documentation

## 2018-06-18 DIAGNOSIS — M1711 Unilateral primary osteoarthritis, right knee: Secondary | ICD-10-CM | POA: Insufficient documentation

## 2018-06-18 DIAGNOSIS — Z720 Tobacco use: Secondary | ICD-10-CM | POA: Insufficient documentation

## 2018-06-18 DIAGNOSIS — I10 Essential (primary) hypertension: Secondary | ICD-10-CM | POA: Diagnosis present

## 2018-06-18 DIAGNOSIS — R4589 Other symptoms and signs involving emotional state: Secondary | ICD-10-CM

## 2018-06-18 DIAGNOSIS — Z7689 Persons encountering health services in other specified circumstances: Secondary | ICD-10-CM | POA: Diagnosis not present

## 2018-06-23 ENCOUNTER — Other Ambulatory Visit: Payer: Self-pay | Admitting: Internal Medicine

## 2018-06-23 DIAGNOSIS — R928 Other abnormal and inconclusive findings on diagnostic imaging of breast: Secondary | ICD-10-CM

## 2018-07-02 ENCOUNTER — Ambulatory Visit
Admission: RE | Admit: 2018-07-02 | Discharge: 2018-07-02 | Disposition: A | Discharge status: Home / Self Care | Payer: 59 | Source: Ambulatory Visit | Attending: Internal Medicine | Admitting: Internal Medicine

## 2018-07-02 DIAGNOSIS — R928 Other abnormal and inconclusive findings on diagnostic imaging of breast: Secondary | ICD-10-CM | POA: Insufficient documentation

## 2018-07-07 ENCOUNTER — Other Ambulatory Visit: Payer: Self-pay | Admitting: Internal Medicine

## 2018-07-07 DIAGNOSIS — R928 Other abnormal and inconclusive findings on diagnostic imaging of breast: Secondary | ICD-10-CM

## 2018-07-07 DIAGNOSIS — N6320 Unspecified lump in the left breast, unspecified quadrant: Secondary | ICD-10-CM

## 2018-07-09 ENCOUNTER — Other Ambulatory Visit: Payer: Self-pay | Admitting: Internal Medicine

## 2018-07-09 DIAGNOSIS — N6320 Unspecified lump in the left breast, unspecified quadrant: Secondary | ICD-10-CM

## 2018-07-16 ENCOUNTER — Ambulatory Visit
Admission: RE | Admit: 2018-07-16 | Discharge: 2018-07-16 | Disposition: A | Discharge status: Home / Self Care | Payer: 59 | Source: Ambulatory Visit | Attending: Internal Medicine | Admitting: Internal Medicine

## 2018-07-16 DIAGNOSIS — N6320 Unspecified lump in the left breast, unspecified quadrant: Secondary | ICD-10-CM | POA: Diagnosis present

## 2018-07-16 DIAGNOSIS — R928 Other abnormal and inconclusive findings on diagnostic imaging of breast: Secondary | ICD-10-CM

## 2018-07-16 HISTORY — PX: BREAST CYST ASPIRATION: SHX578

## 2019-03-25 IMAGING — CR DG TOE 4TH 2+V*L*
3 series · 3 of 3 positions shown · non-contrast
Comparison: None.

CLINICAL DATA: Left fourth toe injury

EXAM:
LEFT FOURTH TOE

[toe ap]
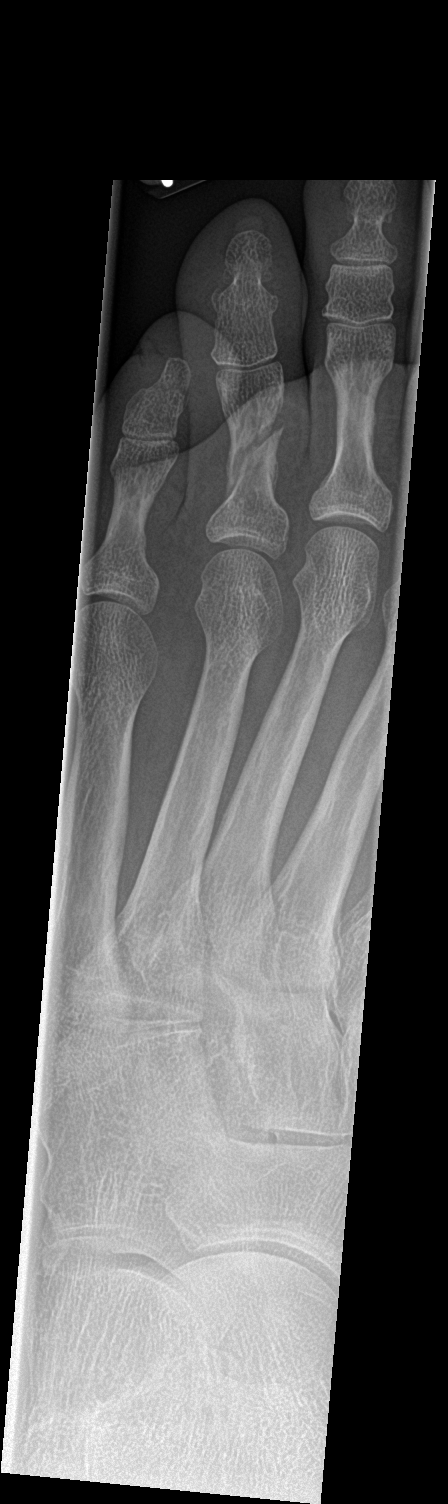

[toe obl]
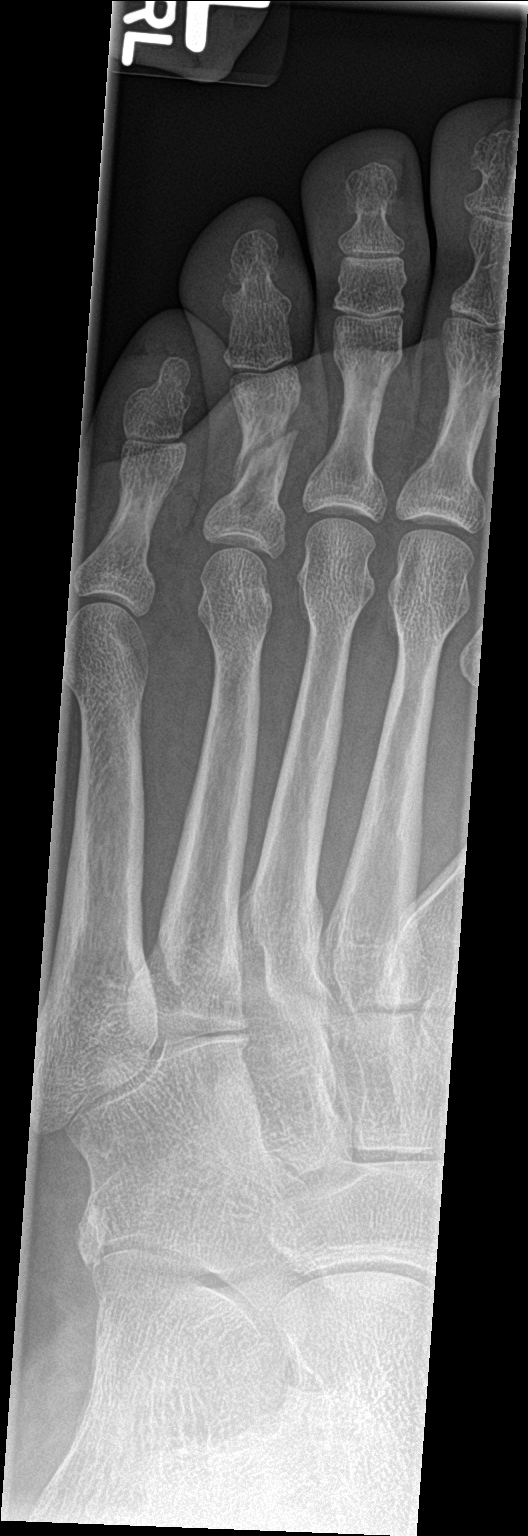

[toe lat]
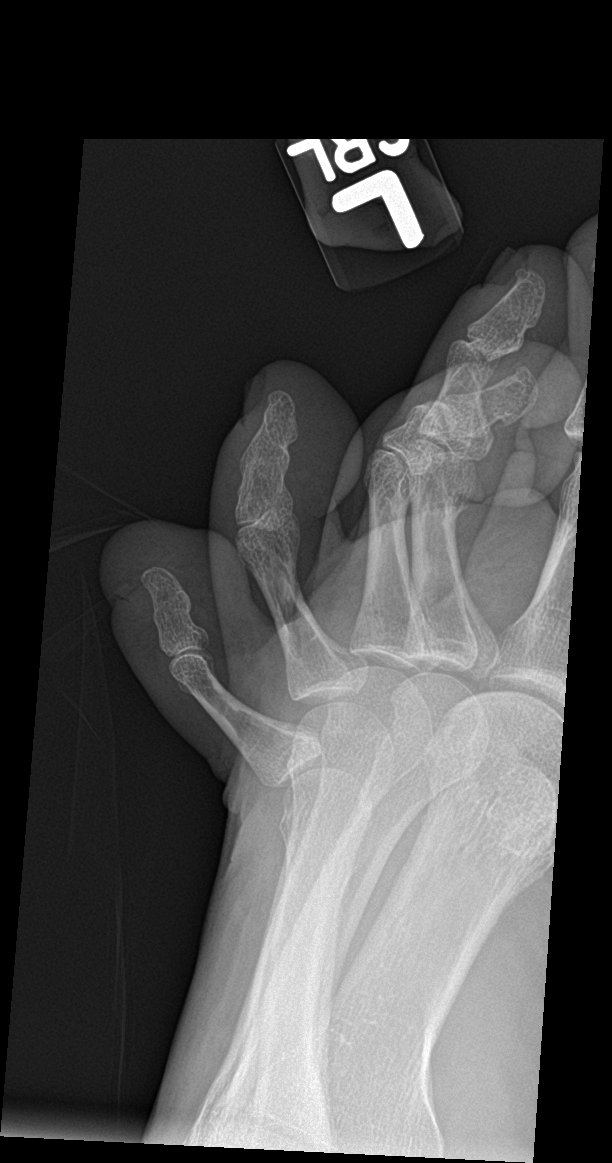

[3 of 3 positions shown; findings below may reference images not displayed]

FINDINGS: There is an oblique fracture of the proximal phalanx of the left
fourth toe. Minimal displacement.
IMPRESSION: Minimally displaced oblique fracture of the left fourth proximal
phalanx.

## 2019-09-15 ENCOUNTER — Other Ambulatory Visit: Payer: Self-pay | Admitting: Internal Medicine

## 2019-09-15 DIAGNOSIS — Z1231 Encounter for screening mammogram for malignant neoplasm of breast: Secondary | ICD-10-CM

## 2019-10-02 IMAGING — US US BREAST*L* LIMITED INC AXILLA
1 series · 5 of 5 positions shown · non-contrast
Comparison: Previous exam(s).

CLINICAL DATA: The patient was called back from her baseline
mammogram for a mass in the medial central left breast.

EXAM:
DIGITAL DIAGNOSTIC LEFT MAMMOGRAM WITH TOMO
ULTRASOUND LEFT BREAST

[Series 1: us breast*left* limited inc axilla · 0.07mm/px · 5 of 5 slices shown]
[im 1/5]
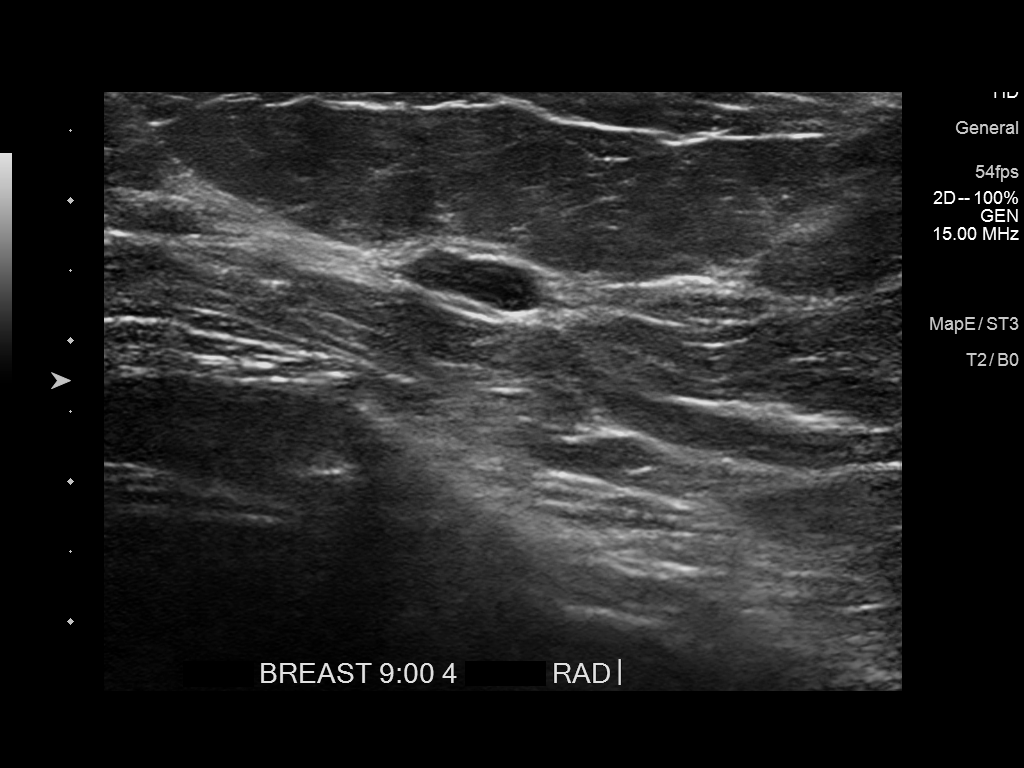
[im 2/5]
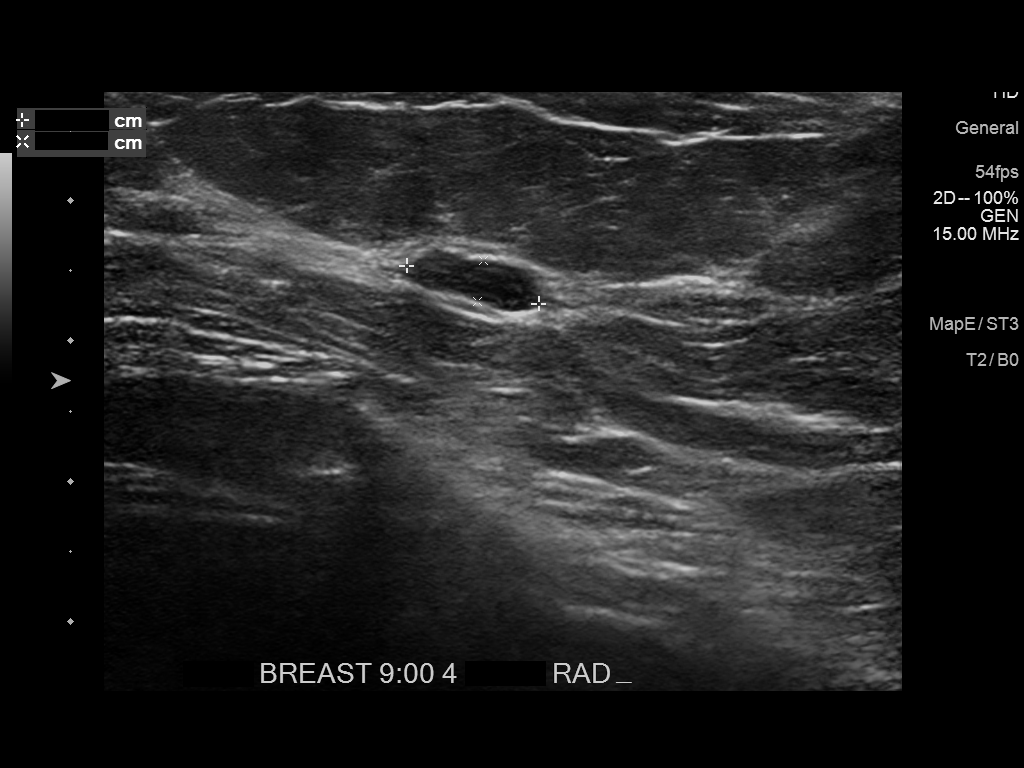
[im 3/5]
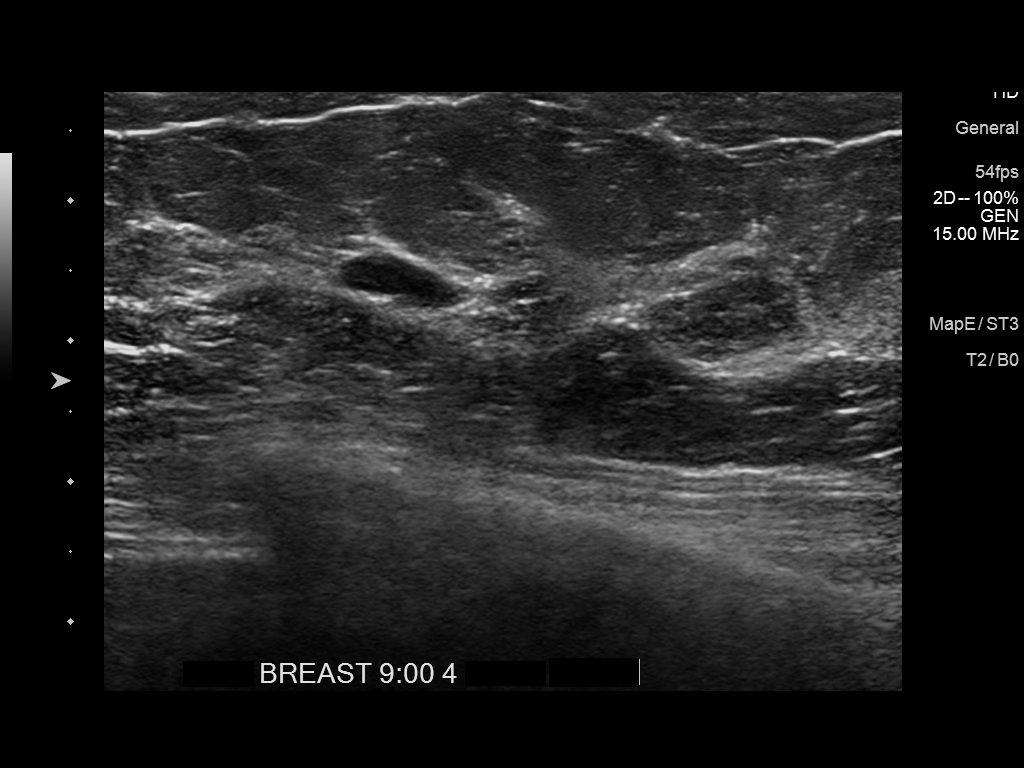
[im 4/5]
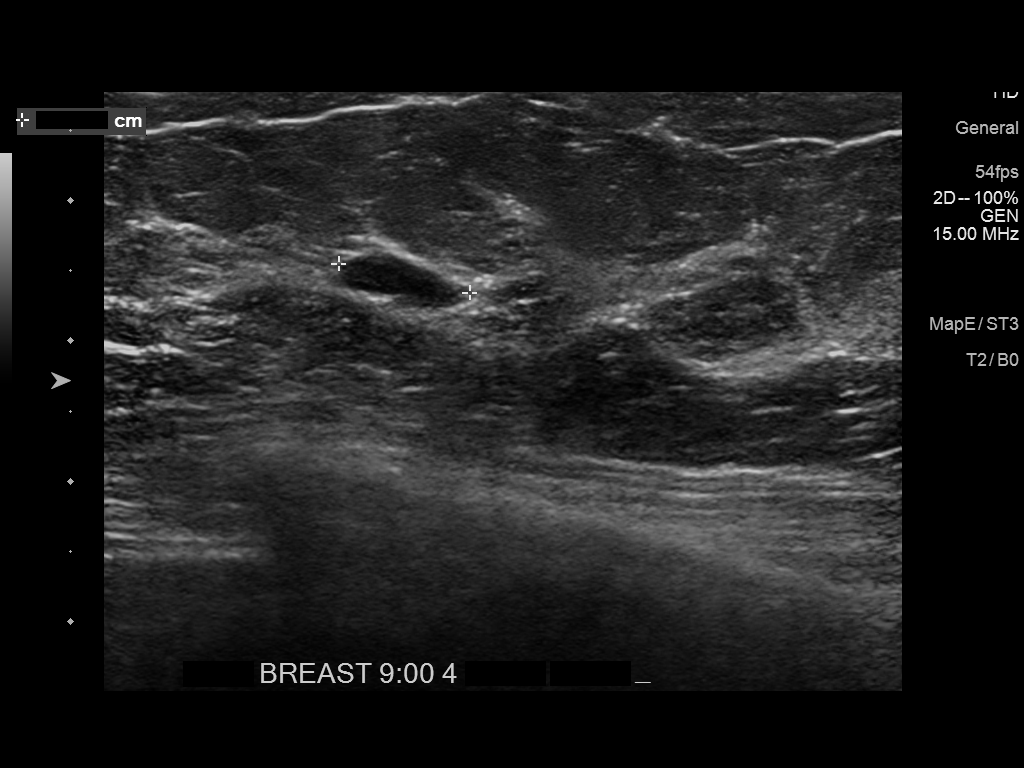
[im 5/5]
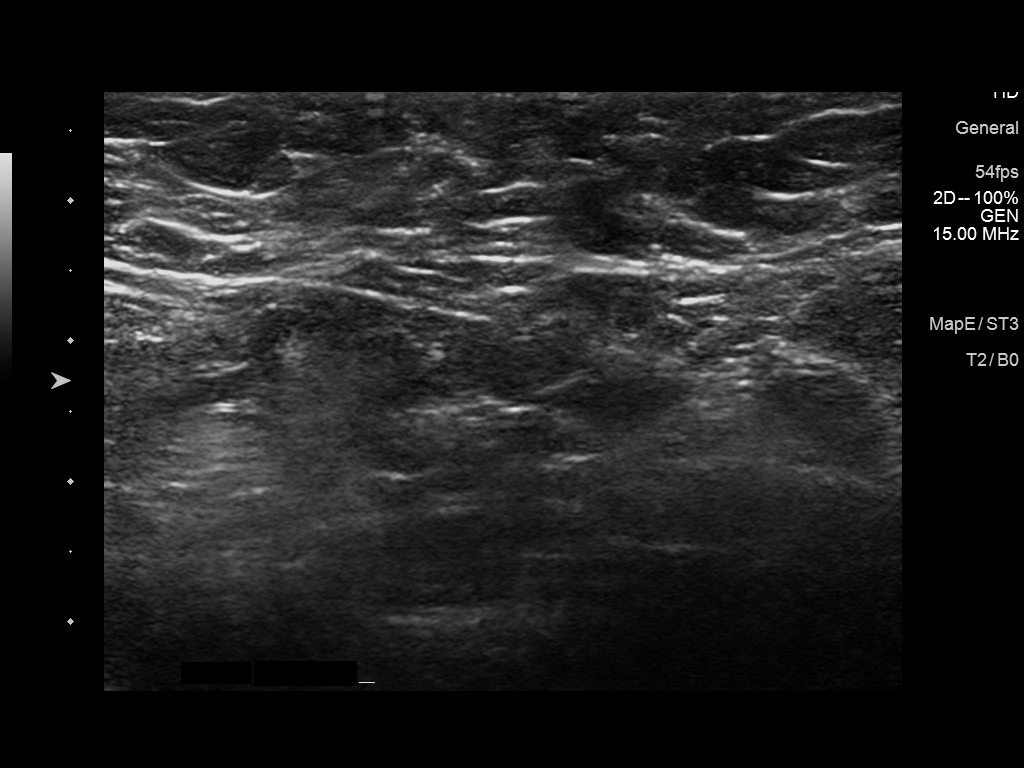

[5 of 5 positions shown; findings below may reference images not displayed]

ACR Breast Density Category b: There are scattered areas of
fibroglandular density.
FINDINGS: The mass in the medial central left breast persists on additional
imaging. It is circumscribed an oval with gentle lobulations.

On physical exam, no suspicious lumps are identified.

Targeted ultrasound is performed, showing a near anechoic mass in
the left breast at 9 o'clock, 4 cm from the nipple measuring 10 x 3
by 10 mm today with very subtle increased through transmission and
no internal blood flow. No axillary adenopathy.
IMPRESSION: The mass in the medial left breast is most likely either a mildly
complicated cyst or a fibroadenoma.

RECOMMENDATION:
A six-month follow-up mammogram and ultrasound was recommended to
the patient. Due to anxiety, she requests an attempted aspiration.
If aspiration is not successful, she requests a biopsy.

I have discussed the findings and recommendations with the patient.
Results were also provided in writing at the conclusion of the
visit. If applicable, a reminder letter will be sent to the patient
regarding the next appointment.

BI-RADS CATEGORY  3: Probably benign.

## 2019-10-13 ENCOUNTER — Ambulatory Visit
Admission: RE | Admit: 2019-10-13 | Discharge: 2019-10-13 | Disposition: A | Discharge status: Home / Self Care | Payer: Managed Care, Other (non HMO) | Source: Ambulatory Visit | Attending: Internal Medicine | Admitting: Internal Medicine

## 2019-10-13 DIAGNOSIS — Z1231 Encounter for screening mammogram for malignant neoplasm of breast: Secondary | ICD-10-CM

## 2019-12-20 IMAGING — MG ULTRASOUND GUIDED BREAST CYST ASPIRATION
1 series · 8 of 8 positions shown · non-contrast
Comparison: Previous exams.

CLINICAL DATA: Probable left breast cyst.  Aspiration requested.

EXAM:
ULTRASOUND GUIDED LEFT BREAST CYST ASPIRATION

[Series 1: MG view · 0.06mm/px · 8 of 8 slices shown]
[im 1/8]
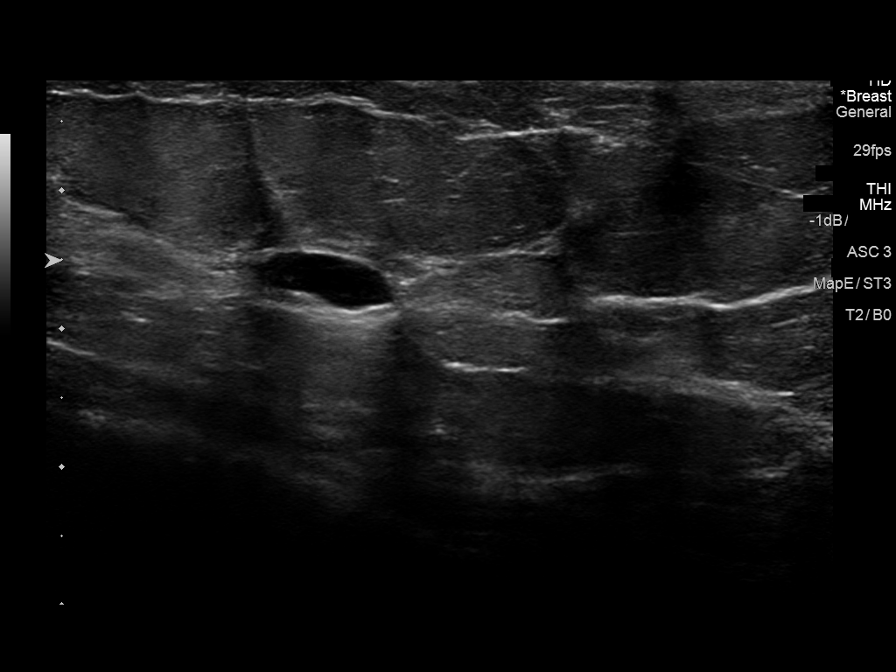
[im 2/8]
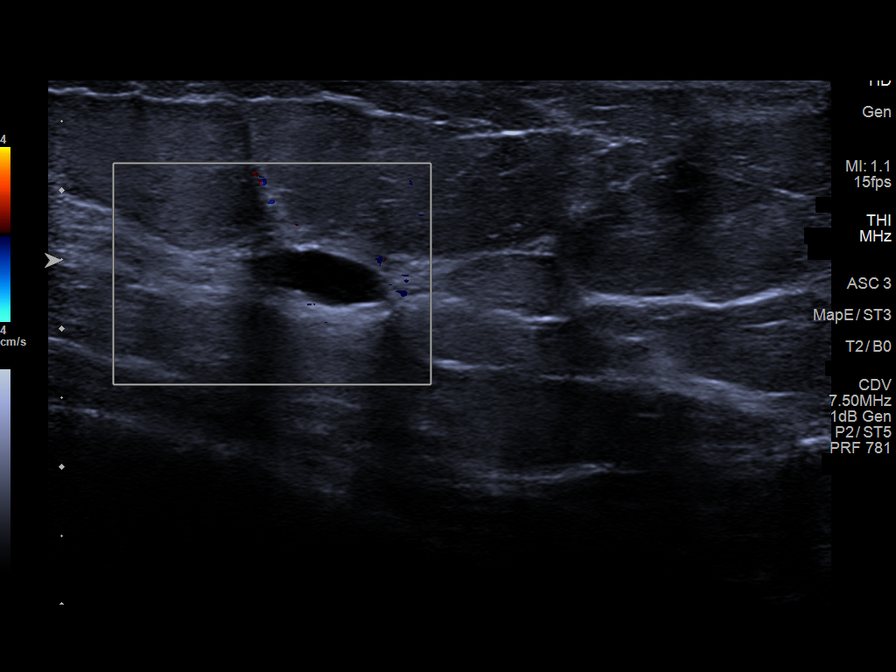
[im 3/8]
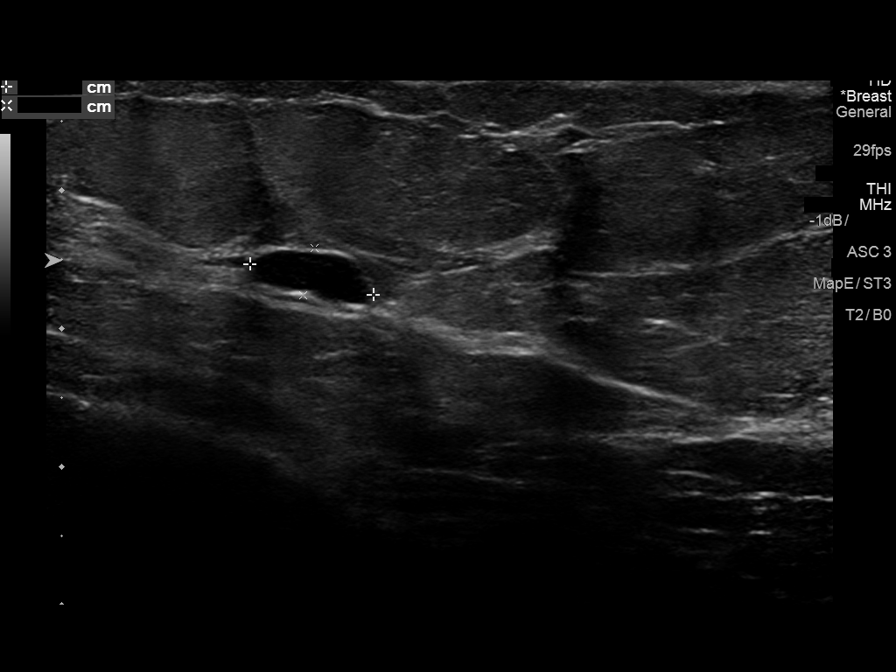
[im 4/8]
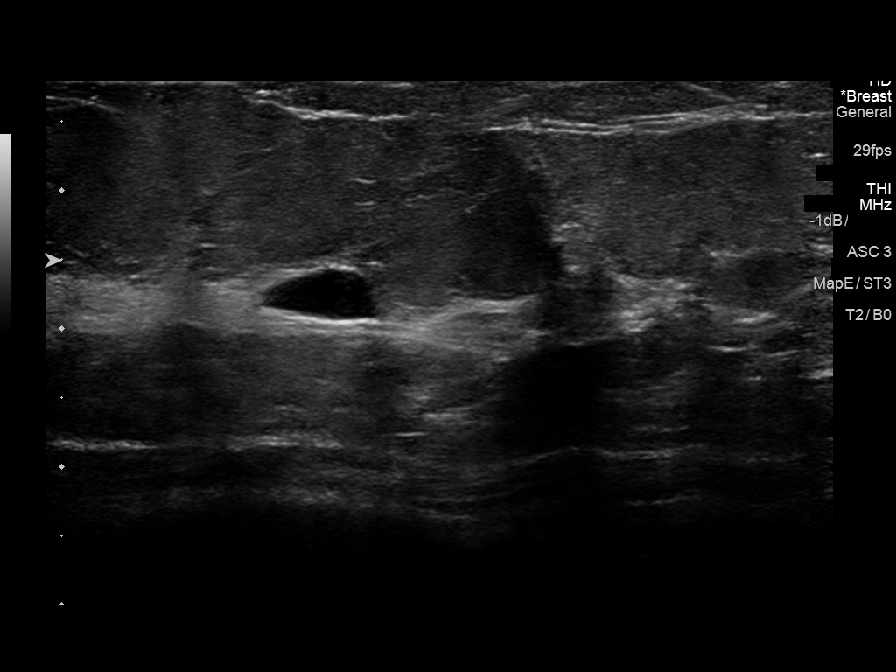
[im 5/8]
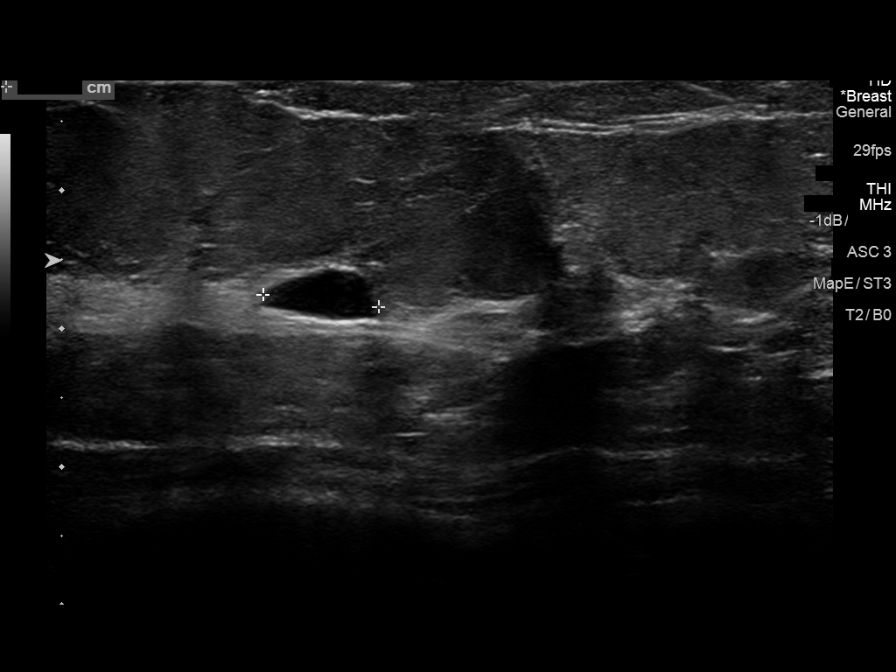
[im 6/8]
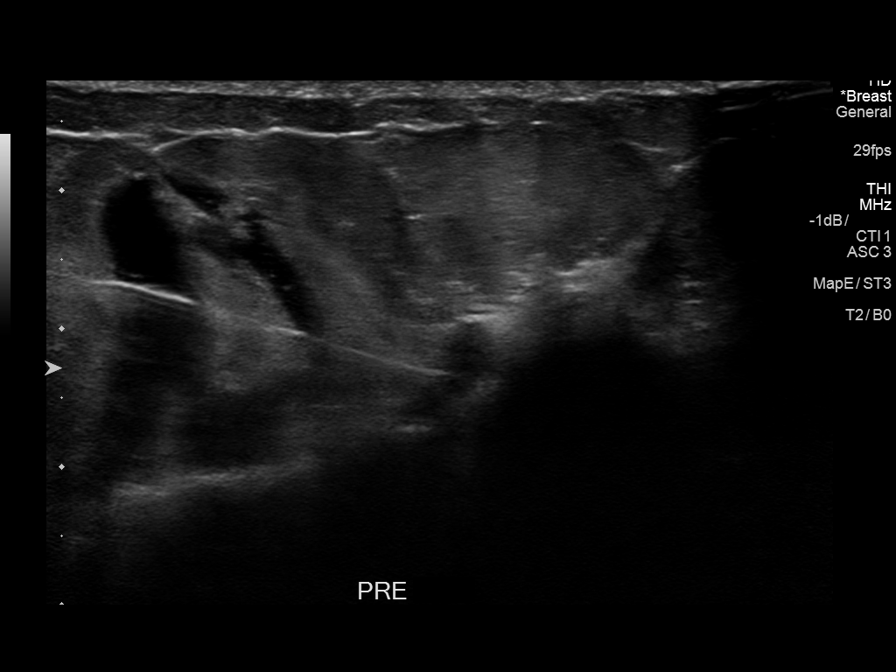
[im 7/8]
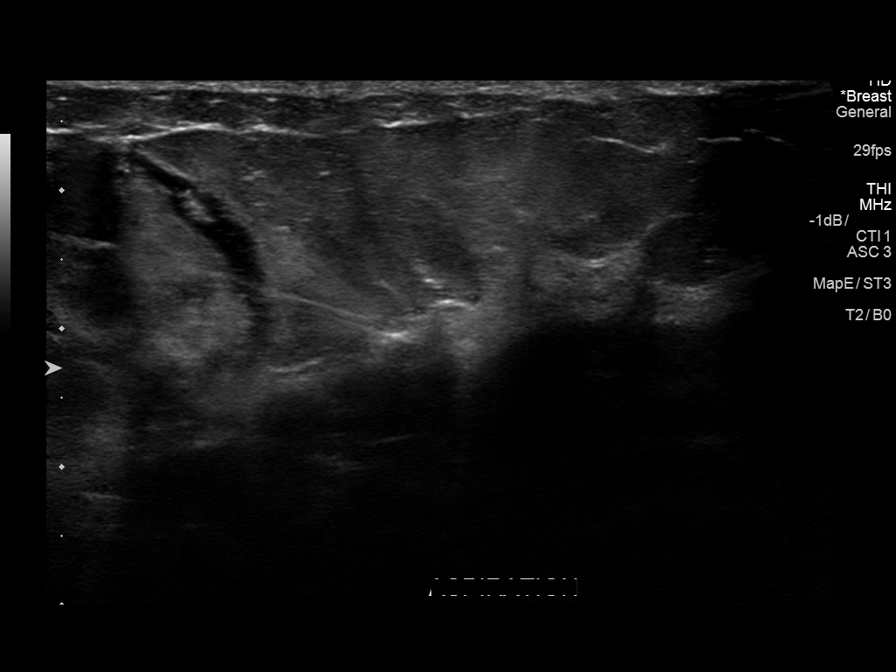
[im 8/8]
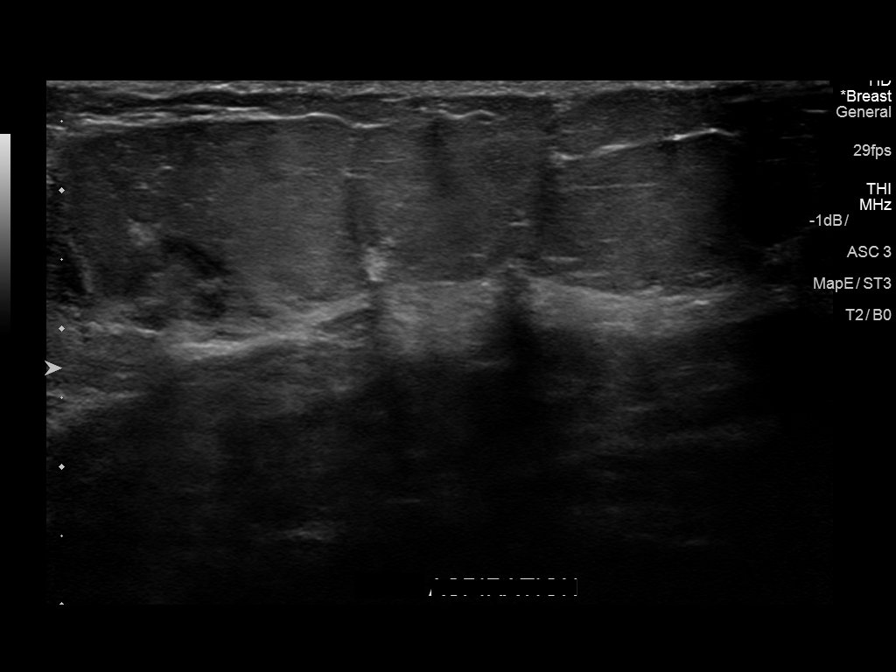

[8 of 8 positions shown; findings below may reference images not displayed]

PROCEDURE:
Using sterile technique, 1% lidocaine, under direct ultrasound
visualization, needle aspiration of a cyst in the 9 o'clock region
of the left breast was performed. Approximately [DATE] cc of clear
yellow fluid was aspirated and the cyst decompressed.
IMPRESSION: Ultrasound-guided aspiration of a left breast cyst no apparent
complications.

RECOMMENDATIONS:
Bilateral screening mammogram in Tuesday June, 2019 is recommended.

## 2021-06-09 ENCOUNTER — Other Ambulatory Visit: Payer: Self-pay | Admitting: Internal Medicine

## 2021-06-09 DIAGNOSIS — Z1231 Encounter for screening mammogram for malignant neoplasm of breast: Secondary | ICD-10-CM

## 2022-07-12 ENCOUNTER — Other Ambulatory Visit: Payer: Self-pay | Admitting: Internal Medicine

## 2022-07-12 DIAGNOSIS — Z1231 Encounter for screening mammogram for malignant neoplasm of breast: Secondary | ICD-10-CM

## 2024-01-13 ENCOUNTER — Other Ambulatory Visit: Payer: Self-pay | Admitting: Internal Medicine

## 2024-01-13 DIAGNOSIS — Z1231 Encounter for screening mammogram for malignant neoplasm of breast: Secondary | ICD-10-CM

## 2024-04-13 ENCOUNTER — Ambulatory Visit
Admission: RE | Admit: 2024-04-13 | Discharge: 2024-04-13 | Disposition: A | Discharge status: Home / Self Care | Source: Ambulatory Visit | Attending: Internal Medicine | Admitting: Internal Medicine

## 2024-04-13 DIAGNOSIS — Z1231 Encounter for screening mammogram for malignant neoplasm of breast: Secondary | ICD-10-CM | POA: Diagnosis present

## 2024-06-16 ENCOUNTER — Ambulatory Visit: Payer: Self-pay

## 2024-06-16 DIAGNOSIS — K621 Rectal polyp: Secondary | ICD-10-CM | POA: Diagnosis not present

## 2024-06-16 DIAGNOSIS — K5730 Diverticulosis of large intestine without perforation or abscess without bleeding: Secondary | ICD-10-CM | POA: Diagnosis not present

## 2024-06-16 DIAGNOSIS — K640 First degree hemorrhoids: Secondary | ICD-10-CM | POA: Diagnosis not present

## 2024-06-16 DIAGNOSIS — Z1211 Encounter for screening for malignant neoplasm of colon: Secondary | ICD-10-CM | POA: Diagnosis present

## 2024-09-04 ENCOUNTER — Emergency Department: Admission: EM | Admit: 2024-09-04 | Discharge: 2024-09-04 | Disposition: A | Discharge status: Home / Self Care

## 2024-09-04 ENCOUNTER — Other Ambulatory Visit: Payer: Self-pay

## 2024-09-04 DIAGNOSIS — S39012A Strain of muscle, fascia and tendon of lower back, initial encounter: Secondary | ICD-10-CM | POA: Diagnosis not present

## 2024-09-04 DIAGNOSIS — R059 Cough, unspecified: Secondary | ICD-10-CM | POA: Insufficient documentation

## 2024-09-04 DIAGNOSIS — M545 Low back pain, unspecified: Secondary | ICD-10-CM | POA: Diagnosis present

## 2024-09-04 DIAGNOSIS — X509XXA Other and unspecified overexertion or strenuous movements or postures, initial encounter: Secondary | ICD-10-CM | POA: Insufficient documentation

## 2024-09-04 MED ORDER — BENZONATATE 100 MG PO CAPS: 100.0000 mg | ORAL_CAPSULE | Freq: Three times a day (TID) | ORAL | 0 refills | Status: AC | PRN

## 2024-09-04 MED ORDER — METHOCARBAMOL 500 MG PO TABS: 1000.0000 mg | ORAL_TABLET | Freq: Three times a day (TID) | ORAL | 0 refills | Status: AC

## 2024-09-04 NOTE — ED Triage Notes (Addendum)
 Pt states she has been coughing really bad and pulled a muscle in her back about three days ago. States the pain has continued to get worse.

## 2024-09-04 NOTE — Discharge Instructions (Addendum)
 You can take 650 mg of Tylenol  and 600 mg of ibuprofen every 6 hours as needed for pain. You can use ice, heat, muscle creams and other topical pain relievers as well.  I have sent some cough medication called Tessalon.  This could taken 3 times daily as needed.  I have sent a muscle relaxer to your pharmacy. This can be taken every 8 hours as needed for muscle spasms. This medication is sedating, so do not drive for 8 hours after taking it.   If you have back pain, most people start to feel better within a few weeks. Staying active and returning to your normal activities, even if you have pain, helps you recover faster. Avoid staying in bed for long periods, as this can slow your recovery.   Return to the ED or contact your doctor right away if you experience any of the following: New weakness, numbness or tingling in your legs or feet Loss of control of your bladder or bowels (trouble peeing or pooping, or accidents) Severe pain that does not improve or gets much worse Fever, chills or unexplained weight loss Pain after a fall, injury or trauma Difficulty walking or standing  Try to keep moving and do your usual activities is much as you can.  If your pain does not improve after a few weeks, or if you have questions or concerns, please follow-up with your doctor.  Remember a small increase in pain when you move it is normal and does not mean you are causing harm.  Staying active is important for your recovery.

## 2024-09-04 NOTE — ED Provider Notes (Signed)
 "  [facility] Provider Note    Event Date/Time   First MD Initiated Contact with Patient 09/04/24 1937     (approximate)   History   Back Pain   HPI  Mallory Gardner is a 54 y.o. female with no PMH presents for evaluation of right lower back pain that began after a coughing episode.  Patient states that she thinks she has pulled a muscle in her back.  She reports the pain has continued to get worse.  No fevers, loss of bladder or bowel control, urinary symptoms.      Physical Exam   Triage Vital Signs: ED Triage Vitals  Encounter Vitals Group     BP 09/04/24 1827 127/81     Girls Systolic BP Percentile --      Girls Diastolic BP Percentile --      Boys Systolic BP Percentile --      Boys Diastolic BP Percentile --      Pulse Rate 09/04/24 1827 81     Resp 09/04/24 1827 18     Temp 09/04/24 1827 98.6 F (37 C)     Temp Source 09/04/24 1827 Oral     SpO2 09/04/24 1827 96 %     Weight 09/04/24 1834 200 lb (90.7 kg)     Height 09/04/24 1834 5' 6 (1.676 m)     Head Circumference --      Peak Flow --      Pain Score 09/04/24 1832 8     Pain Loc --      Pain Education --      Exclude from Growth Chart --     Most recent vital signs: Vitals:   09/04/24 1827  BP: 127/81  Pulse: 81  Resp: 18  Temp: 98.6 F (37 C)  SpO2: 96%   General: Awake, no distress.  CV:  Good peripheral perfusion.  RRR Resp:  Normal effort.  CTAB. Abd:  No distention.  Other:  Tenderness to palpation over the spine, does have tenderness in the paraspinal muscles on the right lower back.  Patient can walk without difficulty.   ED Results / Procedures / Treatments   Labs (all labs ordered are listed, but only abnormal results are displayed) Labs Reviewed - No data to display   PROCEDURES:  Critical Care performed: No  Procedures   MEDICATIONS ORDERED IN ED: Medications - No data to display   IMPRESSION / MDM / ASSESSMENT AND PLAN / ED COURSE  I  reviewed the triage vital signs and the nursing notes.                             54 year old female presents for evaluation of back pain after coughing episode.  Vital signs are stable patient NAD on exam.  Differential diagnosis includes, but is not limited to, muscle strain, lumbar radiculopathy, arthritis, spinal stenosis, acute spinal cord injury unlikely.  Patient's presentation is most consistent with acute, uncomplicated illness.  Considered lumbar x-ray but patient is not tender over the bones and did not have a fall or trauma.  Considered lumbar MRI but patient does not present with any red flag signs or symptoms for back pain.  On physical exam I was able to pinpoint exactly where patient's pain was.  She is quite tender in the paraspinal muscles of the right lower back.  Recommended she continue taking Tylenol  and ibuprofen as well as using topical  pain relievers.  I will send a muscle relaxer for her to try as well as some cough medicine.  Patient did not need a note for work.  She voiced understanding, all questions were answered and she was stable at discharge.      FINAL CLINICAL IMPRESSION(S) / ED DIAGNOSES   Final diagnoses:  Strain of lumbar region, initial encounter     Rx / DC Orders   ED Discharge Orders          Ordered    benzonatate  (TESSALON ) 100 MG capsule  3 times daily PRN        09/04/24 2049    methocarbamol  (ROBAXIN ) 500 MG tablet  3 times daily        09/04/24 2049             Note:  This document was prepared using Dragon voice recognition software and may include unintentional dictation errors.   Cleaster Tinnie LABOR, PA-C 09/04/24 2049    Clarine Ozell LABOR, MD 09/05/24 0015  "
